# Patient Record
Sex: Female | Born: 1960 | Race: White | Hispanic: No | Marital: Single | Smoking: Never smoker
Health system: Southern US, Community
[De-identification: ages and names within clinical notes are randomized; demographics above are authoritative.]

## PROBLEM LIST (undated history)

## (undated) DIAGNOSIS — S52502A Unspecified fracture of the lower end of left radius, initial encounter for closed fracture: Secondary | ICD-10-CM

## (undated) DIAGNOSIS — F32A Depression, unspecified: Secondary | ICD-10-CM

## (undated) DIAGNOSIS — E785 Hyperlipidemia, unspecified: Secondary | ICD-10-CM

## (undated) DIAGNOSIS — F329 Major depressive disorder, single episode, unspecified: Secondary | ICD-10-CM

## (undated) DIAGNOSIS — I10 Essential (primary) hypertension: Secondary | ICD-10-CM

## (undated) DIAGNOSIS — F419 Anxiety disorder, unspecified: Secondary | ICD-10-CM

## (undated) HISTORY — PX: BREAST CYST ASPIRATION: SHX578

## (undated) HISTORY — PX: CERVICAL DISC ARTHROPLASTY: SHX587

## (undated) HISTORY — PX: AUGMENTATION MAMMAPLASTY: SUR837

---
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
## 2019-05-26 ENCOUNTER — Ambulatory Visit (INDEPENDENT_AMBULATORY_CARE_PROVIDER_SITE_OTHER): Payer: Managed Care, Other (non HMO)

## 2019-05-26 ENCOUNTER — Encounter (HOSPITAL_COMMUNITY): Payer: Self-pay

## 2019-05-26 ENCOUNTER — Ambulatory Visit (HOSPITAL_COMMUNITY)
Admission: EM | Admit: 2019-05-26 | Discharge: 2019-05-26 | Disposition: A | Discharge status: Home / Self Care | Payer: Managed Care, Other (non HMO) | Attending: Physician Assistant | Admitting: Physician Assistant

## 2019-05-26 ENCOUNTER — Other Ambulatory Visit: Payer: Self-pay

## 2019-05-26 DIAGNOSIS — S52572A Other intraarticular fracture of lower end of left radius, initial encounter for closed fracture: Secondary | ICD-10-CM

## 2019-05-26 HISTORY — DX: Hyperlipidemia, unspecified: E78.5

## 2019-05-26 HISTORY — DX: Essential (primary) hypertension: I10

## 2019-05-26 MED ORDER — HYDROCODONE-ACETAMINOPHEN 5-325 MG PO TABS
ORAL_TABLET | ORAL | Status: AC
  Filled 2019-05-26: qty 1

## 2019-05-26 MED ORDER — HYDROCODONE-ACETAMINOPHEN 5-325 MG PO TABS: 1.0000 | ORAL_TABLET | ORAL | 0 refills | Status: AC | PRN

## 2019-05-26 MED ORDER — ONDANSETRON 4 MG PO TBDP
ORAL_TABLET | ORAL | Status: AC
  Filled 2019-05-26: qty 1

## 2019-05-26 MED ORDER — HYDROCODONE-ACETAMINOPHEN 5-325 MG PO TABS
1.0000 | ORAL_TABLET | Freq: Once | ORAL | Status: AC
  Administered 2019-05-26: 1 via ORAL

## 2019-05-26 MED ORDER — ONDANSETRON 4 MG PO TBDP
4.0000 mg | ORAL_TABLET | Freq: Once | ORAL | Status: AC
  Administered 2019-05-26: 12:00:00 4 mg via ORAL

## 2019-05-26 NOTE — Discharge Instructions (Addendum)
Take the Norco/vicodin every 6 hours as needed for moderate to severe pain. Do not drive, operate heavy machinery or drink while taking this medication  You may take ibuprofen for milder pain. If you decide to take only tylenol, please ensure it is more than 4-6 hours before or after taking a norco/vicodin.  Please follow up with the Orthopedic office given.

## 2019-05-26 NOTE — ED Provider Notes (Signed)
[place]    CSN: [id number] Arrival date & time: 05/26/19  1046      History   Chief Complaint Chief Complaint  Patient presents with  . Wrist Pain    HPI Janet Bryan is a 59 y.o. female.   Patient reports to urgent care today for Left wrist injury sustained yesterday. She was letting her dog out of its crate in her vehicle when the dog knocked her over and she struck her left wrist onto a curb behind her. She is unsure if she only hit the wrist or if she used it to brace her fall. She has noted swelling and pain over the wrist and base of thumb. She has had minimal ability to use the wrist and has taken ibuprofen and it does not help much.      Past Medical History:  Diagnosis Date  . Hyperlipemia   . Hypertension     There are no problems to display for this patient.   Past Surgical History:  Procedure Laterality Date  . CERVICAL DISC ARTHROPLASTY      OB History   No obstetric history on file.      Home Medications    Prior to Admission medications   Medication Sig Start Date End Date Taking? Authorizing Provider  FLUoxetine (PROZAC) 40 MG capsule Take 40 mg by mouth daily.   Yes [provider]  quinapril (ACCUPRIL) 10 MG tablet Take 10 mg by mouth daily.   Yes [provider]  simvastatin (ZOCOR) 40 MG tablet Take 40 mg by mouth daily.   Yes [provider]  HYDROcodone-acetaminophen (NORCO/VICODIN) 5-325 MG tablet Take 1-2 tablets by mouth every 4 (four) hours as needed for up to 10 days for moderate pain or severe pain. 05/26/19 06/05/19  Kriss Ishler, Marguerita Beards, PA-C    Family History Family History  Problem Relation Age of Onset  . Healthy Mother   . Healthy Father     Social History Social History   Tobacco Use  . Smoking status: Never Smoker  . Smokeless tobacco: Never Used  Substance Use Topics  . Alcohol use: Not Currently  . Drug use: Not Currently     Allergies   Patient has no known  allergies.   Review of Systems Review of Systems  Musculoskeletal: Positive for arthralgias, joint swelling and myalgias.  Skin: Positive for color change. Negative for wound.  Neurological: Negative for dizziness, weakness, numbness and headaches.  All other systems reviewed and are negative.    Physical Exam Triage Vital Signs ED Triage Vitals  Enc Vitals Group     BP      Pulse      Resp      Temp      Temp src      SpO2      Weight      Height      Head Circumference      Peak Flow      Pain Score      Pain Loc      Pain Edu?      Excl. in [place]?    No data found.  Updated Vital Signs BP 111/83 (BP Location: Right Arm)   Pulse 94   Temp 98.6 F (37 C) (Oral)   Resp 18   SpO2 100%   Visual Acuity Right Eye Distance:   Left Eye Distance:   Bilateral Distance:    Right Eye Near:   Left Eye Near:  Bilateral Near:     Physical Exam Vitals and nursing note reviewed.  Constitutional:      General: She is not in acute distress.    Appearance: Normal appearance. She is well-developed.  HENT:     Head: Normocephalic and atraumatic.  Eyes:     Conjunctiva/sclera: Conjunctivae normal.     Pupils: Pupils are equal, round, and reactive to light.  Cardiovascular:     Rate and Rhythm: Normal rate.     Pulses: Normal pulses.  Pulmonary:     Effort: Pulmonary effort is normal. No respiratory distress.  Abdominal:     Palpations: Abdomen is soft.     Tenderness: There is no abdominal tenderness.  Musculoskeletal:     Cervical back: Neck supple.     Comments: Left wrist with swelling an erythema. TTP across distal radius.  Strong radial pulse, good cap refill, sensation intact  Skin:    General: Skin is warm and dry.     Findings: Bruising and erythema present.  Neurological:     General: No focal deficit present.     Mental Status: She is alert and oriented to person, place, and time.     Sensory: No sensory deficit.  Psychiatric:        Mood and  Affect: Mood normal.        Behavior: Behavior normal.        Thought Content: Thought content normal.        Judgment: Judgment normal.      UC Treatments / Results  Labs (all labs ordered are listed, but only abnormal results are displayed) Labs Reviewed - No data to display  EKG   Radiology DG Wrist Complete Left  Result Date: 05/26/2019 CLINICAL DATA:  59 year old female status post left wrist pain and swelling after falling yesterday afternoon EXAM: LEFT WRIST - COMPLETE 3+ VIEW COMPARISON:  None. FINDINGS: Acute, comminuted and impacted fracture of the distal radius with intra-articular extension. No significant angulation of the fracture site. Extensive soft tissue swelling is present around the wrist. The pronator quadratus fat pad is bowed anteriorly consistent with presence of fracture related hematoma. The distal ulna remains intact. The scaphoid is intact. The remaining bones and joints are intact and unremarkable. IMPRESSION: Acute mildly comminuted and impacted distal radius fracture with intra-articular extension but no significant angulation. Electronically Signed   By: Malachy Moan M.D.   On: 05/26/2019 12:07    Procedures Procedures (including critical care time)  Medications Ordered in UC Medications  HYDROcodone-acetaminophen (NORCO/VICODIN) 5-325 MG per tablet 1 tablet (1 tablet Oral Given 05/26/19 1207)  ondansetron (ZOFRAN-ODT) disintegrating tablet 4 mg (4 mg Oral Given 05/26/19 1207)    Initial Impression / Assessment and Plan / UC Course  I have reviewed the triage vital signs and the nursing notes.  Pertinent labs & imaging results that were available during my care of the patient were reviewed by me and considered in my medical decision making (see chart for details).     #Comminuted Distal Radius fracture - sugar tong applied. Norco 5 in clinic. Sent Norco for pain management. Orthopedic information given. Neurovascularly intact.    Final  Clinical Impressions(s) / UC Diagnoses   Final diagnoses:  Other closed intra-articular fracture of distal end of left radius, initial encounter     Discharge Instructions     Take the Norco/vicodin every 6 hours as needed for moderate to severe pain. Do not drive, operate heavy machinery or drink while taking this  medication  You may take ibuprofen for milder pain. If you decide to take only tylenol, please ensure it is more than 4-6 hours before or after taking a norco/vicodin.  Please follow up with the Orthopedic office given.       ED Prescriptions    Medication Sig Dispense Auth. Provider   HYDROcodone-acetaminophen (NORCO/VICODIN) 5-325 MG tablet Take 1-2 tablets by mouth every 4 (four) hours as needed for up to 10 days for moderate pain or severe pain. 6 tablet Tiffanee Mcnee, Veryl Speak, PA-C     I have reviewed the PDMP during this encounter.   Hermelinda Medicus, PA-C 05/26/19 1345

## 2019-05-26 NOTE — ED Triage Notes (Signed)
Pt states tripped when dog interfered w/her ambulation; landed on a curb hurting her left wrist/hand. Left wrist and hand with deformity, ecchymosis, edema. Neurovascular intact. +1 radial pulse.  Denies LOC or head injury or further injury.

## 2019-05-26 NOTE — ED Notes (Signed)
Ortho aware of need for short arm splint.   

## 2019-05-30 ENCOUNTER — Other Ambulatory Visit: Payer: Self-pay

## 2019-05-30 ENCOUNTER — Encounter: Payer: Self-pay | Admitting: Orthopaedic Surgery

## 2019-05-30 ENCOUNTER — Ambulatory Visit (INDEPENDENT_AMBULATORY_CARE_PROVIDER_SITE_OTHER): Payer: Managed Care, Other (non HMO) | Admitting: Orthopaedic Surgery

## 2019-05-30 DIAGNOSIS — S52572A Other intraarticular fracture of lower end of left radius, initial encounter for closed fracture: Secondary | ICD-10-CM | POA: Diagnosis not present

## 2019-05-30 DIAGNOSIS — S52502A Unspecified fracture of the lower end of left radius, initial encounter for closed fracture: Secondary | ICD-10-CM | POA: Insufficient documentation

## 2019-05-30 MED ORDER — CALCIUM CARBONATE-VITAMIN D 500-200 MG-UNIT PO TABS: 1.0000 | ORAL_TABLET | Freq: Three times a day (TID) | ORAL | 6 refills | Status: AC

## 2019-05-30 MED ORDER — ONDANSETRON HCL 4 MG PO TABS: 4.0000 mg | ORAL_TABLET | Freq: Three times a day (TID) | ORAL | 0 refills | Status: AC | PRN

## 2019-05-30 MED ORDER — VITAMIN C 500 MG PO CHEW: 500.0000 mg | CHEWABLE_TABLET | Freq: Two times a day (BID) | ORAL | 0 refills | Status: AC

## 2019-05-30 MED ORDER — HYDROCODONE-ACETAMINOPHEN 5-325 MG PO TABS: 1.0000 | ORAL_TABLET | Freq: Two times a day (BID) | ORAL | 0 refills | Status: AC | PRN

## 2019-05-30 NOTE — Progress Notes (Signed)
Office Visit Note   Patient: Janet Bryan           Date of Birth: 08-22-1960           MRN: [id number] Visit Date: 05/30/2019              Requested by: No referring provider defined for this encounter. PCP: Patient, No Pcp Per   Assessment & Plan: Visit Diagnoses:  1. Other closed intra-articular fracture of distal end of left radius, initial encounter     Plan: My impression is minimally displaced intra-articular distal radius fracture.  I reviewed the x-rays with the patient in detail and given the orientation of the fracture I feel that the fracture is susceptible to displacement especially the lunate facet piece.  Given these findings my recommendation is for surgical stabilization.  Risk benefits alternatives were discussed with the patient.  Based on our discussion patient has elected to proceed with ORIF early next week.  In the meantime we will place her back in the sugar tong splint and she is to keep this elevated at all times.  I will refill her pain medications as well as her postoperative medications today.  Follow-Up Instructions: Return for 2 week postop visit.   Orders:  No orders of the defined types were placed in this encounter.  Meds ordered this encounter  Medications  . HYDROcodone-acetaminophen (NORCO) 5-325 MG tablet    Sig: Take 1-2 tablets by mouth 2 (two) times daily as needed.    Dispense:  40 tablet    Refill:  0  . calcium-vitamin D (OSCAL WITH D) 500-200 MG-UNIT tablet    Sig: Take 1 tablet by mouth 3 (three) times daily.    Dispense:  90 tablet    Refill:  6  . Ascorbic Acid (VITAMIN C) 500 MG CHEW    Sig: Chew 1 tablet (500 mg total) by mouth 2 (two) times daily.    Dispense:  84 tablet    Refill:  0  . ondansetron (ZOFRAN) 4 MG tablet    Sig: Take 1-2 tablets (4-8 mg total) by mouth every 8 (eight) hours as needed for nausea or vomiting.    Dispense:  20 tablet    Refill:  0      Procedures: No procedures performed   Clinical  Data: No additional findings.   Subjective: Chief Complaint  Patient presents with  . Left Wrist - Pain    Janet Bryan is a very pleasant 59 year old female comes in for evaluation of a left distal radius fracture.  She suffered this on January 28 on the day that she moved here from [place].  She is right-hand dominant and works as a Research scientist (medical).  She was originally evaluated at an urgent care and x-rays were performed and she was placed in a sugar tong splint.  She denies any numbness and tingling.  She does have swelling and bruising.   Review of Systems  Constitutional: Negative.   HENT: Negative.   Eyes: Negative.   Respiratory: Negative.   Cardiovascular: Negative.   Endocrine: Negative.   Musculoskeletal: Negative.   Neurological: Negative.   Hematological: Negative.   Psychiatric/Behavioral: Negative.   All other systems reviewed and are negative.    Objective: Vital Signs: There were no vitals taken for this visit.  Physical Exam Vitals and nursing note reviewed.  Constitutional:      Appearance: She is well-developed.  HENT:     Head: Normocephalic and atraumatic.  Pulmonary:  Effort: Pulmonary effort is normal.  Abdominal:     Palpations: Abdomen is soft.  Musculoskeletal:     Cervical back: Neck supple.  Skin:    General: Skin is warm.     Capillary Refill: Capillary refill takes less than 2 seconds.  Neurological:     Mental Status: She is alert and oriented to person, place, and time.  Psychiatric:        Behavior: Behavior normal.        Thought Content: Thought content normal.        Judgment: Judgment normal.     Ortho Exam Left upper extremity exam shows moderate swelling and bruising.  No signs of carpal tunnel syndrome.  Neurovascular intact distally.  Strong radial pulse. Specialty Comments:  No specialty comments available.  Imaging: No results found.   PMFS History: Patient Active Problem List   Diagnosis Date Noted  . Closed fracture  of left distal radius 05/30/2019   Past Medical History:  Diagnosis Date  . Hyperlipemia   . Hypertension     Family History  Problem Relation Age of Onset  . Healthy Mother   . Healthy Father     Past Surgical History:  Procedure Laterality Date  . CERVICAL DISC ARTHROPLASTY     Social History   Occupational History  . Not on file  Tobacco Use  . Smoking status: Never Smoker  . Smokeless tobacco: Never Used  Substance and Sexual Activity  . Alcohol use: Not Currently  . Drug use: Not Currently  . Sexual activity: Not on file

## 2019-05-31 ENCOUNTER — Other Ambulatory Visit: Payer: Self-pay

## 2019-05-31 ENCOUNTER — Encounter (HOSPITAL_BASED_OUTPATIENT_CLINIC_OR_DEPARTMENT_OTHER): Payer: Self-pay | Admitting: Orthopaedic Surgery

## 2019-06-01 ENCOUNTER — Encounter (HOSPITAL_BASED_OUTPATIENT_CLINIC_OR_DEPARTMENT_OTHER)
Admission: RE | Admit: 2019-06-01 | Discharge: 2019-06-01 | Disposition: A | Discharge status: Home / Self Care | Payer: Managed Care, Other (non HMO) | Source: Ambulatory Visit

## 2019-06-01 ENCOUNTER — Other Ambulatory Visit (HOSPITAL_COMMUNITY)
Admission: RE | Admit: 2019-06-01 | Discharge: 2019-06-01 | Disposition: A | Discharge status: Home / Self Care | Payer: Managed Care, Other (non HMO) | Source: Ambulatory Visit | Attending: Orthopaedic Surgery | Admitting: Orthopaedic Surgery

## 2019-06-01 DIAGNOSIS — I10 Essential (primary) hypertension: Secondary | ICD-10-CM | POA: Diagnosis not present

## 2019-06-01 DIAGNOSIS — Z01812 Encounter for preprocedural laboratory examination: Secondary | ICD-10-CM | POA: Insufficient documentation

## 2019-06-01 DIAGNOSIS — E785 Hyperlipidemia, unspecified: Secondary | ICD-10-CM | POA: Diagnosis not present

## 2019-06-01 DIAGNOSIS — F419 Anxiety disorder, unspecified: Secondary | ICD-10-CM | POA: Diagnosis not present

## 2019-06-01 DIAGNOSIS — F329 Major depressive disorder, single episode, unspecified: Secondary | ICD-10-CM | POA: Diagnosis not present

## 2019-06-01 DIAGNOSIS — Z20822 Contact with and (suspected) exposure to covid-19: Secondary | ICD-10-CM | POA: Insufficient documentation

## 2019-06-01 DIAGNOSIS — Z79899 Other long term (current) drug therapy: Secondary | ICD-10-CM | POA: Diagnosis not present

## 2019-06-01 DIAGNOSIS — X58XXXA Exposure to other specified factors, initial encounter: Secondary | ICD-10-CM | POA: Diagnosis not present

## 2019-06-01 DIAGNOSIS — S52572A Other intraarticular fracture of lower end of left radius, initial encounter for closed fracture: Secondary | ICD-10-CM | POA: Diagnosis present

## 2019-06-01 LAB — SARS CORONAVIRUS 2 (TAT 6-24 HRS): SARS Coronavirus 2: NEGATIVE

## 2019-06-01 NOTE — Progress Notes (Signed)
EKG reviewed by Dr. Odonno, will proceed with surgery as scheduled. 

## 2019-06-01 NOTE — Progress Notes (Signed)
Spoke with Cordelia Pen, Dr. Warren Danes scheduler and made her aware that this pt will need an overnight bed due to the fact that she is new in [place] and has no one to pick her up or stay overnight with her after her surgery.

## 2019-06-01 NOTE — Progress Notes (Signed)

## 2019-06-05 ENCOUNTER — Ambulatory Visit (HOSPITAL_BASED_OUTPATIENT_CLINIC_OR_DEPARTMENT_OTHER): Payer: Managed Care, Other (non HMO) | Admitting: Certified Registered"

## 2019-06-05 ENCOUNTER — Encounter (HOSPITAL_BASED_OUTPATIENT_CLINIC_OR_DEPARTMENT_OTHER): Payer: Self-pay | Admitting: Orthopaedic Surgery

## 2019-06-05 ENCOUNTER — Observation Stay (HOSPITAL_BASED_OUTPATIENT_CLINIC_OR_DEPARTMENT_OTHER)
Admission: RE | Admit: 2019-06-05 | Discharge: 2019-06-06 | Disposition: A | Discharge status: Home / Self Care | Payer: Managed Care, Other (non HMO) | Attending: Orthopaedic Surgery | Admitting: Orthopaedic Surgery

## 2019-06-05 ENCOUNTER — Other Ambulatory Visit: Payer: Self-pay

## 2019-06-05 ENCOUNTER — Encounter (HOSPITAL_BASED_OUTPATIENT_CLINIC_OR_DEPARTMENT_OTHER)
Admission: RE | Disposition: A | Discharge status: Home / Self Care | Payer: Self-pay | Source: Home / Self Care | Attending: Orthopaedic Surgery

## 2019-06-05 DIAGNOSIS — E785 Hyperlipidemia, unspecified: Secondary | ICD-10-CM | POA: Insufficient documentation

## 2019-06-05 DIAGNOSIS — S52572A Other intraarticular fracture of lower end of left radius, initial encounter for closed fracture: Secondary | ICD-10-CM | POA: Diagnosis not present

## 2019-06-05 DIAGNOSIS — Z79899 Other long term (current) drug therapy: Secondary | ICD-10-CM | POA: Insufficient documentation

## 2019-06-05 DIAGNOSIS — Z9889 Other specified postprocedural states: Secondary | ICD-10-CM

## 2019-06-05 DIAGNOSIS — F419 Anxiety disorder, unspecified: Secondary | ICD-10-CM | POA: Insufficient documentation

## 2019-06-05 DIAGNOSIS — S52502A Unspecified fracture of the lower end of left radius, initial encounter for closed fracture: Secondary | ICD-10-CM

## 2019-06-05 DIAGNOSIS — X58XXXA Exposure to other specified factors, initial encounter: Secondary | ICD-10-CM | POA: Insufficient documentation

## 2019-06-05 DIAGNOSIS — F329 Major depressive disorder, single episode, unspecified: Secondary | ICD-10-CM | POA: Insufficient documentation

## 2019-06-05 DIAGNOSIS — I10 Essential (primary) hypertension: Secondary | ICD-10-CM | POA: Insufficient documentation

## 2019-06-05 HISTORY — DX: Anxiety disorder, unspecified: F41.9

## 2019-06-05 HISTORY — DX: Depression, unspecified: F32.A

## 2019-06-05 HISTORY — PX: OPEN REDUCTION INTERNAL FIXATION (ORIF) DISTAL RADIAL FRACTURE: SHX5989

## 2019-06-05 HISTORY — DX: Unspecified fracture of the lower end of left radius, initial encounter for closed fracture: S52.502A

## 2019-06-05 SURGERY — OPEN REDUCTION INTERNAL FIXATION (ORIF) DISTAL RADIUS FRACTURE
Anesthesia: Monitor Anesthesia Care | Site: Wrist | Laterality: Left

## 2019-06-05 MED ORDER — CHLORHEXIDINE GLUCONATE 4 % EX LIQD: 60.0000 mL | Freq: Once | CUTANEOUS | Status: DC

## 2019-06-05 MED ORDER — FLUOXETINE HCL 40 MG PO CAPS: 40.0000 mg | ORAL_CAPSULE | Freq: Every day | ORAL | Status: DC

## 2019-06-05 MED ORDER — ONDANSETRON HCL 4 MG/2ML IJ SOLN
INTRAMUSCULAR | Status: DC | PRN
  Administered 2019-06-05: 4 mg via INTRAVENOUS

## 2019-06-05 MED ORDER — CEFAZOLIN SODIUM-DEXTROSE 2-4 GM/100ML-% IV SOLN
INTRAVENOUS | Status: AC
  Filled 2019-06-05: qty 100

## 2019-06-05 MED ORDER — OXYCODONE HCL 5 MG PO TABS
5.0000 mg | ORAL_TABLET | ORAL | Status: DC | PRN
  Filled 2019-06-05: qty 2

## 2019-06-05 MED ORDER — PROPOFOL 500 MG/50ML IV EMUL
INTRAVENOUS | Status: DC | PRN
  Administered 2019-06-05: 125 ug/kg/min via INTRAVENOUS

## 2019-06-05 MED ORDER — METOCLOPRAMIDE HCL 5 MG PO TABS: 5.0000 mg | ORAL_TABLET | Freq: Three times a day (TID) | ORAL | Status: DC | PRN

## 2019-06-05 MED ORDER — METHOCARBAMOL 1000 MG/10ML IJ SOLN: 500.0000 mg | Freq: Four times a day (QID) | INTRAVENOUS | Status: DC | PRN

## 2019-06-05 MED ORDER — METHOCARBAMOL 500 MG PO TABS
500.0000 mg | ORAL_TABLET | Freq: Four times a day (QID) | ORAL | Status: DC | PRN
  Administered 2019-06-05 – 2019-06-06 (×2): 500 mg via ORAL
  Filled 2019-06-05 (×2): qty 1

## 2019-06-05 MED ORDER — ACETAMINOPHEN 325 MG PO TABS
ORAL_TABLET | ORAL | Status: AC
  Filled 2019-06-05: qty 2

## 2019-06-05 MED ORDER — DIPHENHYDRAMINE HCL 12.5 MG/5ML PO ELIX
25.0000 mg | ORAL_SOLUTION | ORAL | Status: DC | PRN
  Administered 2019-06-05: 25 mg via ORAL
  Filled 2019-06-05: qty 10

## 2019-06-05 MED ORDER — OXYCODONE HCL 5 MG PO TABS: 5.0000 mg | ORAL_TABLET | Freq: Once | ORAL | Status: DC | PRN

## 2019-06-05 MED ORDER — LIDOCAINE HCL (CARDIAC) PF 100 MG/5ML IV SOSY
PREFILLED_SYRINGE | INTRAVENOUS | Status: DC | PRN
  Administered 2019-06-05: 50 mg via INTRAVENOUS

## 2019-06-05 MED ORDER — BUPIVACAINE LIPOSOME 1.3 % IJ SUSP
INTRAMUSCULAR | Status: DC | PRN
  Administered 2019-06-05: 10 mL

## 2019-06-05 MED ORDER — BUPIVACAINE HCL (PF) 0.25 % IJ SOLN
INTRAMUSCULAR | Status: AC
  Filled 2019-06-05: qty 30

## 2019-06-05 MED ORDER — ONDANSETRON HCL 4 MG/2ML IJ SOLN: 4.0000 mg | Freq: Four times a day (QID) | INTRAMUSCULAR | Status: DC | PRN

## 2019-06-05 MED ORDER — CALCIUM CARBONATE-VITAMIN D 500-200 MG-UNIT PO TABS: 1.0000 | ORAL_TABLET | Freq: Three times a day (TID) | ORAL | Status: DC

## 2019-06-05 MED ORDER — ONDANSETRON HCL 4 MG/2ML IJ SOLN
INTRAMUSCULAR | Status: AC
  Filled 2019-06-05: qty 2

## 2019-06-05 MED ORDER — SORBITOL 70 % SOLN: 30.0000 mL | Freq: Every day | Status: DC | PRN

## 2019-06-05 MED ORDER — CEFAZOLIN SODIUM-DEXTROSE 2-4 GM/100ML-% IV SOLN
2.0000 g | Freq: Four times a day (QID) | INTRAVENOUS | Status: DC
  Administered 2019-06-05 – 2019-06-06 (×2): 2 g via INTRAVENOUS
  Filled 2019-06-05 (×2): qty 100

## 2019-06-05 MED ORDER — LACTATED RINGERS IV SOLN: INTRAVENOUS | Status: DC

## 2019-06-05 MED ORDER — OXYCODONE HCL 5 MG/5ML PO SOLN: 5.0000 mg | Freq: Once | ORAL | Status: DC | PRN

## 2019-06-05 MED ORDER — SODIUM CHLORIDE 0.9 % IV SOLN: INTRAVENOUS | Status: DC

## 2019-06-05 MED ORDER — SIMVASTATIN 40 MG PO TABS: 40.0000 mg | ORAL_TABLET | Freq: Every day | ORAL | Status: DC

## 2019-06-05 MED ORDER — BUPIVACAINE HCL (PF) 0.5 % IJ SOLN
INTRAMUSCULAR | Status: DC | PRN
  Administered 2019-06-05: 15 mL via PERINEURAL

## 2019-06-05 MED ORDER — HYDROMORPHONE HCL 1 MG/ML IJ SOLN: 0.5000 mg | INTRAMUSCULAR | Status: DC | PRN

## 2019-06-05 MED ORDER — FENTANYL CITRATE (PF) 100 MCG/2ML IJ SOLN
50.0000 ug | INTRAMUSCULAR | Status: DC | PRN
  Administered 2019-06-05: 100 ug via INTRAVENOUS

## 2019-06-05 MED ORDER — PROPOFOL 500 MG/50ML IV EMUL
INTRAVENOUS | Status: AC
  Filled 2019-06-05: qty 50

## 2019-06-05 MED ORDER — KETOROLAC TROMETHAMINE 15 MG/ML IJ SOLN
15.0000 mg | Freq: Four times a day (QID) | INTRAMUSCULAR | Status: DC | PRN
  Administered 2019-06-05: 19:00:00 15 mg via INTRAVENOUS
  Filled 2019-06-05: qty 1

## 2019-06-05 MED ORDER — DOCUSATE SODIUM 100 MG PO CAPS: 100.0000 mg | ORAL_CAPSULE | Freq: Two times a day (BID) | ORAL | Status: DC

## 2019-06-05 MED ORDER — MAGNESIUM CITRATE PO SOLN: 1.0000 | Freq: Once | ORAL | Status: DC | PRN

## 2019-06-05 MED ORDER — ACETAMINOPHEN 325 MG PO TABS: 325.0000 mg | ORAL_TABLET | Freq: Four times a day (QID) | ORAL | Status: DC | PRN

## 2019-06-05 MED ORDER — ONDANSETRON HCL 4 MG PO TABS: 4.0000 mg | ORAL_TABLET | Freq: Four times a day (QID) | ORAL | Status: DC | PRN

## 2019-06-05 MED ORDER — CEFAZOLIN SODIUM-DEXTROSE 2-4 GM/100ML-% IV SOLN
2.0000 g | INTRAVENOUS | Status: AC
  Administered 2019-06-05: 13:00:00 2 g via INTRAVENOUS

## 2019-06-05 MED ORDER — OXYCODONE HCL 5 MG PO TABS
10.0000 mg | ORAL_TABLET | ORAL | Status: DC | PRN
  Administered 2019-06-06 (×2): 10 mg via ORAL
  Filled 2019-06-05: qty 2

## 2019-06-05 MED ORDER — ONDANSETRON HCL 4 MG/2ML IJ SOLN: 4.0000 mg | Freq: Once | INTRAMUSCULAR | Status: DC | PRN

## 2019-06-05 MED ORDER — MIDAZOLAM HCL 2 MG/2ML IJ SOLN
1.0000 mg | INTRAMUSCULAR | Status: DC | PRN
  Administered 2019-06-05: 12:00:00 2 mg via INTRAVENOUS

## 2019-06-05 MED ORDER — ACETAMINOPHEN 325 MG PO TABS
650.0000 mg | ORAL_TABLET | Freq: Once | ORAL | Status: AC
  Administered 2019-06-05: 650 mg via ORAL

## 2019-06-05 MED ORDER — FENTANYL CITRATE (PF) 100 MCG/2ML IJ SOLN: 25.0000 ug | INTRAMUSCULAR | Status: DC | PRN

## 2019-06-05 MED ORDER — VITAMIN C 500 MG PO CHEW: 500.0000 mg | CHEWABLE_TABLET | Freq: Two times a day (BID) | ORAL | Status: DC

## 2019-06-05 MED ORDER — METOCLOPRAMIDE HCL 5 MG/ML IJ SOLN: 5.0000 mg | Freq: Three times a day (TID) | INTRAMUSCULAR | Status: DC | PRN

## 2019-06-05 MED ORDER — PROPOFOL 10 MG/ML IV BOLUS
INTRAVENOUS | Status: AC
  Filled 2019-06-05: qty 20

## 2019-06-05 MED ORDER — POLYETHYLENE GLYCOL 3350 17 G PO PACK: 17.0000 g | PACK | Freq: Every day | ORAL | Status: DC | PRN

## 2019-06-05 MED ORDER — LISINOPRIL 10 MG PO TABS: 10.0000 mg | ORAL_TABLET | Freq: Every day | ORAL | Status: DC

## 2019-06-05 MED ORDER — EPHEDRINE SULFATE-NACL 50-0.9 MG/10ML-% IV SOSY
PREFILLED_SYRINGE | INTRAVENOUS | Status: DC | PRN
  Administered 2019-06-05: 15 mg via INTRAVENOUS

## 2019-06-05 MED ORDER — FENTANYL CITRATE (PF) 100 MCG/2ML IJ SOLN
INTRAMUSCULAR | Status: AC
  Filled 2019-06-05: qty 2

## 2019-06-05 MED ORDER — MIDAZOLAM HCL 2 MG/2ML IJ SOLN
INTRAMUSCULAR | Status: AC
  Filled 2019-06-05: qty 2

## 2019-06-05 SURGICAL SUPPLY — 80 items
BIT DRILL 2.2 SS TIBIAL (BIT) ×2 IMPLANT
BLADE MINI RND TIP GREEN BEAV (BLADE) IMPLANT
BLADE SURG 15 STRL LF DISP TIS (BLADE) ×2 IMPLANT
BLADE SURG 15 STRL SS (BLADE) ×2
BNDG ELASTIC 3X5.8 VLCR STR LF (GAUZE/BANDAGES/DRESSINGS) ×2 IMPLANT
BNDG ELASTIC 4X5.8 VLCR STR LF (GAUZE/BANDAGES/DRESSINGS) IMPLANT
BNDG ESMARK 4X9 LF (GAUZE/BANDAGES/DRESSINGS) ×2 IMPLANT
BRUSH SCRUB EZ PLAIN DRY (MISCELLANEOUS) ×2 IMPLANT
CORD BIPOLAR FORCEPS 12FT (ELECTRODE) ×2 IMPLANT
COVER BACK TABLE 60X90IN (DRAPES) ×2 IMPLANT
COVER MAYO STAND STRL (DRAPES) ×2 IMPLANT
COVER WAND RF STERILE (DRAPES) IMPLANT
CUFF TOURN SGL QUICK 18X4 (TOURNIQUET CUFF) ×2 IMPLANT
DECANTER SPIKE VIAL GLASS SM (MISCELLANEOUS) IMPLANT
DRAPE EXTREMITY T 121X128X90 (DISPOSABLE) ×2 IMPLANT
DRAPE IMP U-DRAPE 54X76 (DRAPES) ×2 IMPLANT
DRAPE OEC MINIVIEW 54X84 (DRAPES) ×2 IMPLANT
DRAPE SURG 17X23 STRL (DRAPES) ×2 IMPLANT
GAUZE 4X4 16PLY RFD (DISPOSABLE) IMPLANT
GAUZE SPONGE 4X4 12PLY STRL (GAUZE/BANDAGES/DRESSINGS) ×2 IMPLANT
GAUZE XEROFORM 1X8 LF (GAUZE/BANDAGES/DRESSINGS) ×2 IMPLANT
GLOVE BIOGEL PI IND STRL 7.0 (GLOVE) ×2 IMPLANT
GLOVE BIOGEL PI INDICATOR 7.0 (GLOVE) ×2
GLOVE ECLIPSE 6.5 STRL STRAW (GLOVE) ×2 IMPLANT
GLOVE ECLIPSE 7.0 STRL STRAW (GLOVE) ×2 IMPLANT
GLOVE SKINSENSE NS SZ7.5 (GLOVE) ×1
GLOVE SKINSENSE STRL SZ7.5 (GLOVE) ×1 IMPLANT
GLOVE SURG SYN 7.5  E (GLOVE) ×1
GLOVE SURG SYN 7.5 E (GLOVE) ×1 IMPLANT
GOWN STRL REIN XL XLG (GOWN DISPOSABLE) ×2 IMPLANT
GOWN STRL REUS W/ TWL LRG LVL3 (GOWN DISPOSABLE) ×1 IMPLANT
GOWN STRL REUS W/ TWL XL LVL3 (GOWN DISPOSABLE) ×1 IMPLANT
GOWN STRL REUS W/TWL LRG LVL3 (GOWN DISPOSABLE) ×1
GOWN STRL REUS W/TWL XL LVL3 (GOWN DISPOSABLE) ×1
K-WIRE 1.6 (WIRE) ×2
K-WIRE FX5X1.6XNS BN SS (WIRE) ×2
KWIRE FX5X1.6XNS BN SS (WIRE) ×2 IMPLANT
NEEDLE HYPO 22GX1.5 SAFETY (NEEDLE) IMPLANT
NS IRRIG 1000ML POUR BTL (IV SOLUTION) ×2 IMPLANT
PACK BASIN DAY SURGERY FS (CUSTOM PROCEDURE TRAY) ×2 IMPLANT
PAD CAST 3X4 CTTN HI CHSV (CAST SUPPLIES) ×1 IMPLANT
PADDING CAST ABS 3INX4YD NS (CAST SUPPLIES)
PADDING CAST ABS COTTON 3X4 (CAST SUPPLIES) IMPLANT
PADDING CAST COTTON 3X4 STRL (CAST SUPPLIES) ×1
PADDING CAST SYNTHETIC 3 NS LF (CAST SUPPLIES) ×1
PADDING CAST SYNTHETIC 3X4 NS (CAST SUPPLIES) ×1 IMPLANT
PADDING CAST SYNTHETIC 4 (CAST SUPPLIES)
PADDING CAST SYNTHETIC 4X4 STR (CAST SUPPLIES) IMPLANT
PLATE STANDARD DVR LEFT (Plate) ×2 IMPLANT
PLATE STD DVR LT 24X51 (Plate) ×1 IMPLANT
SCREW LOCK 14X2.7X 3 LD TPR (Screw) ×3 IMPLANT
SCREW LOCK 16X2.7X 3 LD TPR (Screw) ×2 IMPLANT
SCREW LOCK 18X2.7X 3 LD TPR (Screw) ×1 IMPLANT
SCREW LOCK 20X2.7X 3 LD TPR (Screw) ×2 IMPLANT
SCREW LOCK 22X2.7X 3 LD TPR (Screw) ×1 IMPLANT
SCREW LOCKING 2.7X14 (Screw) ×3 IMPLANT
SCREW LOCKING 2.7X16 (Screw) ×2 IMPLANT
SCREW LOCKING 2.7X18 (Screw) ×1 IMPLANT
SCREW LOCKING 2.7X20MM (Screw) ×2 IMPLANT
SCREW LOCKING 2.7X22MM (Screw) ×1 IMPLANT
SCREW LP NL 2.7X22MM (Screw) ×2 IMPLANT
SLEEVE SCD COMPRESS KNEE MED (MISCELLANEOUS) ×2 IMPLANT
SPLINT FIBERGLASS 3X35 (CAST SUPPLIES) IMPLANT
SPONGE LAP 18X18 RF (DISPOSABLE) ×2 IMPLANT
STOCKINETTE 4X48 STRL (DRAPES) ×2 IMPLANT
SUCTION FRAZIER HANDLE 10FR (MISCELLANEOUS) ×1
SUCTION TUBE FRAZIER 10FR DISP (MISCELLANEOUS) ×1 IMPLANT
SUT ETHILON 3 0 PS 1 (SUTURE) ×2 IMPLANT
SUT ETHILON 4 0 PS 2 18 (SUTURE) ×2 IMPLANT
SUT VIC AB 2-0 SH 27 (SUTURE)
SUT VIC AB 2-0 SH 27XBRD (SUTURE) IMPLANT
SUT VIC AB 3-0 PS1 18 (SUTURE) ×1
SUT VIC AB 3-0 PS1 18XBRD (SUTURE) ×1 IMPLANT
SYR BULB 3OZ (MISCELLANEOUS) IMPLANT
SYR CONTROL 10ML LL (SYRINGE) IMPLANT
TOWEL GREEN STERILE FF (TOWEL DISPOSABLE) ×4 IMPLANT
TRAY DSU PREP LF (CUSTOM PROCEDURE TRAY) ×2 IMPLANT
TUBE CONNECTING 20X1/4 (TUBING) ×2 IMPLANT
UNDERPAD 30X36 HEAVY ABSORB (UNDERPADS AND DIAPERS) ×2 IMPLANT
YANKAUER SUCT BULB TIP NO VENT (SUCTIONS) IMPLANT

## 2019-06-05 NOTE — Anesthesia Procedure Notes (Signed)
Anesthesia Regional Block: Supraclavicular block   Pre-Anesthetic Checklist: ,, timeout performed, Correct Patient, Correct Site, Correct Laterality, Correct Procedure, Correct Position, site marked, Risks and benefits discussed,  Surgical consent,  Pre-op evaluation,  At surgeon's request and post-op pain management  Laterality: Left  Prep: chloraprep       Needles:  Injection technique: Single-shot  Needle Type: Echogenic Stimulator Needle     Needle Length: 9cm  Needle Gauge: 21     Additional Needles:   Procedures:,,,, ultrasound used (permanent image in chart),,,,  Narrative:  Start time: 06/05/2019 12:13 PM End time: 06/05/2019 12:17 PM Injection made incrementally with aspirations every 5 mL.  Performed by: Personally  Anesthesiologist: Lucretia Kern, MD  Additional Notes: Monitors applied. Injection made in 5cc increments. No resistance to injection. Good needle visualization. Patient tolerated procedure well.

## 2019-06-05 NOTE — Op Note (Signed)
   DATE OF SURGERY: 06/05/2019  PREOPERATIVE DIAGNOSIS: Left intraarticular distal radius fracture  POSTOPERATIVE DIAGNOSIS: same  PROCEDURE: Open reduction and internal fixation, left distal radius.  CPT [id number]   IMPLANT:  Implant Name Type Inv. Item Serial No. Manufacturer Lot No. LRB No. Used Action  LOG [id number] - [employer] BIOMET CROSSLOCK DVR SET - 1      Left    SCREW LOCKING 2.7X14 - [id number] Screw SCREW LOCKING 2.7X14  [employer](ORTH,TRAU,BIO,SG)  Left 3 Implanted  SCREW LOCKING 2.7X16 - [id number] Screw SCREW LOCKING 2.7X16  [employer](ORTH,TRAU,BIO,SG)  Left 2 Implanted  SCREW LOCKING 2.7X18 - [id number] Screw SCREW LOCKING 2.7X18  [employer](ORTH,TRAU,BIO,SG)  Left 1 Implanted  SCREW LOCKING 2.7X20MM - [id number] Screw SCREW LOCKING 2.7X20MM  [employer](ORTH,TRAU,BIO,SG)  Left 2 Implanted  SCREW LOCKING 2.7X22MM - [id number] Screw SCREW LOCKING 2.7X22MM  [employer](ORTH,TRAU,BIO,SG)  Left 1 Implanted  SCREW LP NL 2.7X22MM - [id number] Screw SCREW LP NL 2.7X22MM  [employer](ORTH,TRAU,BIO,SG)  Left 1 Implanted  PLATE STANDARD DVR LEFT - [id number] Plate PLATE STANDARD DVR LEFT  [employer](ORTH,TRAU,BIO,SG)  Left 1 Implanted     SURGEON: N. Eduard Roux, M.D.  ASSIST: Ciro Backer [facility] [place], [place]; necessary for the timely completion of procedure and due to complexity of procedure.  ANESTHESIA: Regional and MAC  TOURNIQUET TIME: less than 30 mins  BLOOD LOSS: Minimal.  COMPLICATIONS: see description of procedure.  PATHOLOGY: None.  TIME OUT: Performed before the start of procedure.  INDICATIONS: The patient is a 59 y.o. female who presented with a displaced intraarticular distal radius fracture indicated for osteosynthesis.  DESCRIPTION OF PROCEDURE:  The patient was identified in the preoperative holding area.  The operative site was marked by the surgeon and confirmed by the patient.  He was brought back to the operating room.  Anesthesia was induced by the anesthesia  team.  A well padded nonsterile tourniquet was placed. The operative extremity was prepped and draped in standard sterile fashion.  A timeout was performed.  Preoperative antibiotics were given. The extremity was exsanguinated, tourniquet inflated to 250 mmHg.  A FCR approach was used.  Dissection through the sheath of FCR was carried out and the FCR tendon was retracted. The floor of FCR sheath was released. Flexor tendons and median nerve were retracted ulnarly and protected. Pronator quadratus was elevated from distal radius. Fracture lines were visualized and the fracture demonstrated multiple fragements. The ulnar portion of the distal radius was reduced under fluoroscopy with a tenaculum clamp.  A volar locking plate was applied to the volar surface of the distal radius at the appropriate position. Locking distal locking screws and nonlockgin shaft screws were inserted. Fluoroscopy was used to show adequate reduction and placement of hardware. The tourniquet was deflated. Hemostasis was achieved. The wound was irrigated.  The pronator quadratus was placed back on top of the plate.   Incision closed in layers. Sterile dressing applied. Wrist immobilized in a removable brace. The patient was transferred to the recovery room in stable condition after all counts were correct.  POSTOPERATIVE PLAN: The patient will remain in the brace until instructed. Sutures will be removed in two weeks. About four weeks after surgery, will start wrist range of motion exercises, but in the meantime before then she will start aggressive finger range of motion exercises without resistance.  The "six pack of hand exercises" handout was given to the patient.

## 2019-06-05 NOTE — Anesthesia Postprocedure Evaluation (Signed)
Anesthesia Post Note  Patient: Janet Bryan  Procedure(s) Performed: OPEN REDUCTION INTERNAL FIXATION (ORIF) LEFT DISTAL RADIUS FRACTURE (Left Wrist)     Patient location during evaluation: PACU Anesthesia Type: Regional and MAC Level of consciousness: awake and alert Pain management: pain level controlled Vital Signs Assessment: post-procedure vital signs reviewed and stable Respiratory status: spontaneous breathing, nonlabored ventilation, respiratory function stable and patient connected to nasal cannula oxygen Cardiovascular status: stable and blood pressure returned to baseline Postop Assessment: no apparent nausea or vomiting Anesthetic complications: no    Last Vitals:  Vitals:   06/05/19 1447 06/05/19 1500  BP:  126/87  Pulse: 62 66  Resp: 15 16  Temp:    SpO2: 96% 93%    Last Pain:  Vitals:   06/05/19 1442  TempSrc:   PainSc: 6                  Ayano Douthitt L Katalyna Socarras

## 2019-06-05 NOTE — H&P (Signed)
PREOPERATIVE H&P  Chief Complaint: left distal radius fracture  HPI: Janet Bryan is a 59 y.o. female who presents for surgical treatment of left distal radius fracture.  She denies any changes in medical history.  Past Medical History:  Diagnosis Date  . Anxiety   . Depression   . Distal radius fracture, left   . Hyperlipemia   . Hypertension    Past Surgical History:  Procedure Laterality Date  . CERVICAL DISC ARTHROPLASTY     Social History   Socioeconomic History  . Marital status: Single    Spouse name: Not on file  . Number of children: Not on file  . Years of education: Not on file  . Highest education level: Not on file  Occupational History  . Not on file  Tobacco Use  . Smoking status: Never Smoker  . Smokeless tobacco: Never Used  Substance and Sexual Activity  . Alcohol use: Yes    Comment: social  . Drug use: Not Currently  . Sexual activity: Not Currently    Birth control/protection: Post-menopausal  Other Topics Concern  . Not on file  Social History Narrative  . Not on file   Social Determinants of Health   Financial Resource Strain:   . Difficulty of Paying Living Expenses: Not on file  Food Insecurity:   . Worried About Programme researcher, broadcasting/film/video in the Last Year: Not on file  . Ran Out of Food in the Last Year: Not on file  Transportation Needs:   . Lack of Transportation (Medical): Not on file  . Lack of Transportation (Non-Medical): Not on file  Physical Activity:   . Days of Exercise per Week: Not on file  . Minutes of Exercise per Session: Not on file  Stress:   . Feeling of Stress : Not on file  Social Connections:   . Frequency of Communication with Friends and Family: Not on file  . Frequency of Social Gatherings with Friends and Family: Not on file  . Attends Religious Services: Not on file  . Active Member of Clubs or Organizations: Not on file  . Attends Banker Meetings: Not on file  . Marital Status: Not on  file   Family History  Problem Relation Age of Onset  . Healthy Mother   . Healthy Father    No Known Allergies Prior to Admission medications   Medication Sig Start Date End Date Taking? Authorizing Provider  Ascorbic Acid (VITAMIN C) 500 MG CHEW Chew 1 tablet (500 mg total) by mouth 2 (two) times daily. 05/30/19  Yes Tarry Kos, MD  calcium-vitamin D (OSCAL WITH D) 500-200 MG-UNIT tablet Take 1 tablet by mouth 3 (three) times daily. 05/30/19  Yes Tarry Kos, MD  FLUoxetine (PROZAC) 40 MG capsule Take 40 mg by mouth daily.   Yes [provider]  HYDROcodone-acetaminophen (NORCO) 5-325 MG tablet Take 1-2 tablets by mouth 2 (two) times daily as needed. 05/30/19  Yes Tarry Kos, MD  quinapril (ACCUPRIL) 10 MG tablet Take 10 mg by mouth daily.   Yes [provider]  simvastatin (ZOCOR) 40 MG tablet Take 40 mg by mouth daily.   Yes [provider]  HYDROcodone-acetaminophen (NORCO/VICODIN) 5-325 MG tablet Take 1-2 tablets by mouth every 4 (four) hours as needed for up to 10 days for moderate pain or severe pain. 05/26/19 06/05/19  Darr, Veryl Speak, PA-C  ondansetron (ZOFRAN) 4 MG tablet Take 1-2 tablets (4-8 mg total) by mouth  every 8 (eight) hours as needed for nausea or vomiting. 05/30/19   Leandrew Koyanagi, MD     Positive ROS: All other systems have been reviewed and were otherwise negative with the exception of those mentioned in the HPI and as above.  Physical Exam: General: Alert, no acute distress Cardiovascular: No pedal edema Respiratory: No cyanosis, no use of accessory musculature GI: abdomen soft Skin: No lesions in the area of chief complaint Neurologic: Sensation intact distally Psychiatric: Patient is competent for consent with normal mood and affect Lymphatic: no lymphedema  MUSCULOSKELETAL: exam stable  Assessment: left distal radius fracture  Plan: Plan for Procedure(s): OPEN REDUCTION INTERNAL FIXATION (ORIF) LEFT DISTAL RADIUS  FRACTURE  The risks benefits and alternatives were discussed with the patient including but not limited to the risks of nonoperative treatment, versus surgical intervention including infection, bleeding, nerve injury,  blood clots, cardiopulmonary complications, morbidity, mortality, among others, and they were willing to proceed.   Eduard Roux, MD   06/05/2019 7:01 AM

## 2019-06-05 NOTE — Progress Notes (Signed)
Assisted Dr. Witman with left, ultrasound guided, supraclavicular block. Side rails up, monitors on throughout procedure. See vital signs in flow sheet. Tolerated Procedure well. °

## 2019-06-05 NOTE — Discharge Instructions (Signed)

## 2019-06-05 NOTE — Anesthesia Preprocedure Evaluation (Addendum)
Anesthesia Evaluation  Patient identified by MRN, date of birth, ID band Patient awake    Reviewed: Allergy & Precautions, NPO status , Patient's Chart, lab work & pertinent test results  History of Anesthesia Complications Negative for: history of anesthetic complications  Airway Mallampati: II  TM Distance: >3 FB Neck ROM: Full    Dental  (+) Teeth Intact   Pulmonary neg pulmonary ROS,    Pulmonary exam normal        Cardiovascular hypertension, Normal cardiovascular exam     Neuro/Psych PSYCHIATRIC DISORDERS Anxiety Depression negative neurological ROS     GI/Hepatic negative GI ROS, Neg liver ROS,   Endo/Other  negative endocrine ROS  Renal/GU negative Renal ROS  negative genitourinary   Musculoskeletal negative musculoskeletal ROS (+)   Abdominal   Peds  Hematology negative hematology ROS (+)   Anesthesia Other Findings   Reproductive/Obstetrics                            Anesthesia Physical Anesthesia Plan  ASA: II  Anesthesia Plan: MAC and Regional   Post-op Pain Management:  Regional for Post-op pain   Induction: Intravenous  PONV Risk Score and Plan: Propofol infusion, TIVA and Treatment may vary due to age or medical condition  Airway Management Planned: Natural Airway, Nasal Cannula and Simple Face Mask  Additional Equipment: None  Intra-op Plan:   Post-operative Plan:   Informed Consent: I have reviewed the patients History and Physical, chart, labs and discussed the procedure including the risks, benefits and alternatives for the proposed anesthesia with the patient or authorized representative who has indicated his/her understanding and acceptance.       Plan Discussed with:   Anesthesia Plan Comments:        Anesthesia Quick Evaluation

## 2019-06-05 NOTE — Transfer of Care (Signed)
Immediate Anesthesia Transfer of Care Note  Patient: Janet Bryan  Procedure(s) Performed: OPEN REDUCTION INTERNAL FIXATION (ORIF) LEFT DISTAL RADIUS FRACTURE (Left Wrist)  Patient Location: PACU  Anesthesia Type:MAC and Regional  Level of Consciousness: awake, alert  and oriented  Airway & Oxygen Therapy: Patient Spontanous Breathing  Post-op Assessment: Report given to RN and Post -op Vital signs reviewed and stable  Post vital signs: Reviewed and stable  Last Vitals:  Vitals Value Taken Time  BP    Temp    Pulse 79 06/05/19 1410  Resp 17 06/05/19 1410  SpO2 93 % 06/05/19 1410  Vitals shown include unvalidated device data.  Last Pain:  Vitals:   06/05/19 1122  TempSrc: Tympanic  PainSc: 0-No pain      Patients Stated Pain Goal: 8 (08/65/78 4696)  Complications: No apparent anesthesia complications

## 2019-06-06 ENCOUNTER — Encounter: Payer: Self-pay | Admitting: *Deleted

## 2019-06-06 DIAGNOSIS — S52572A Other intraarticular fracture of lower end of left radius, initial encounter for closed fracture: Secondary | ICD-10-CM | POA: Diagnosis not present

## 2019-06-08 NOTE — Discharge Summary (Signed)
Patient ID: Janet Bryan MRN: [id number] DOB/AGE: [birth year] 60 y.o.  Admit date: 06/05/2019 Discharge date: 06/08/2019  Admission Diagnoses:  Closed fracture of left distal radius  Discharge Diagnoses:  Principal Problem:   Closed fracture of left distal radius Active Problems:   History of open reduction and internal fixation (ORIF) procedure   Past Medical History:  Diagnosis Date  . Anxiety   . Depression   . Distal radius fracture, left   . Hyperlipemia   . Hypertension     Surgeries: Procedure(s): OPEN REDUCTION INTERNAL FIXATION (ORIF) LEFT DISTAL RADIUS FRACTURE on 06/05/2019   Consultants (if any):   Discharged Condition: Improved  Hospital Course: Janet Bryan is an 59 y.o. female who was admitted 06/05/2019 with a diagnosis of Closed fracture of left distal radius and went to the operating room on 06/05/2019 and underwent the above named procedures.    She was given perioperative antibiotics:  Anti-infectives (From admission, onward)   Start     Dose/Rate Route Frequency Ordered Stop   06/05/19 1445  ceFAZolin (ANCEF) IVPB 2g/100 mL premix  Status:  Discontinued     2 g 200 mL/hr over 30 Minutes Intravenous Every 6 hours 06/05/19 1439 06/06/19 0844   06/05/19 1115  ceFAZolin (ANCEF) IVPB 2g/100 mL premix     2 g 200 mL/hr over 30 Minutes Intravenous On call to O.R. 06/05/19 1102 06/05/19 1325    .  She was given sequential compression devices, early ambulation, and appropriate chemoprophylaxis for DVT prophylaxis.  She benefited maximally from the hospital stay and there were no complications.    Recent vital signs:  Vitals:   06/06/19 0600 06/06/19 0630  BP:  106/70  Pulse:  78  Resp:  16  Temp:  98 F (36.7 C)  SpO2: 95% 97%    Recent laboratory studies:  No results found for: HGB No results found for: WBC, PLT No results found for: INR No results found for: NA, K, CL, CO2, BUN, CREATININE, GLUCOSE  Discharge Medications:     Allergies as of 06/06/2019   No Known Allergies     Medication List    TAKE these medications   calcium-vitamin D 500-200 MG-UNIT tablet Commonly known as: OSCAL WITH D Take 1 tablet by mouth 3 (three) times daily.   FLUoxetine 40 MG capsule Commonly known as: PROZAC Take 40 mg by mouth daily.   HYDROcodone-acetaminophen 5-325 MG tablet Commonly known as: Norco Take 1-2 tablets by mouth 2 (two) times daily as needed.   ondansetron 4 MG tablet Commonly known as: ZOFRAN Take 1-2 tablets (4-8 mg total) by mouth every 8 (eight) hours as needed for nausea or vomiting.   quinapril 10 MG tablet Commonly known as: ACCUPRIL Take 10 mg by mouth daily.   simvastatin 40 MG tablet Commonly known as: ZOCOR Take 40 mg by mouth daily.   Vitamin C 500 MG Chew Chew 1 tablet (500 mg total) by mouth 2 (two) times daily.     ASK your doctor about these medications   HYDROcodone-acetaminophen 5-325 MG tablet Commonly known as: NORCO/VICODIN Take 1-2 tablets by mouth every 4 (four) hours as needed for up to 10 days for moderate pain or severe pain. Ask about: Should I take this medication?       Diagnostic Studies: DG Wrist Complete Left  Result Date: 05/26/2019 CLINICAL DATA:  59 year old female status post left wrist pain and swelling after falling yesterday afternoon EXAM: LEFT WRIST - COMPLETE 3+ VIEW  COMPARISON:  None. FINDINGS: Acute, comminuted and impacted fracture of the distal radius with intra-articular extension. No significant angulation of the fracture site. Extensive soft tissue swelling is present around the wrist. The pronator quadratus fat pad is bowed anteriorly consistent with presence of fracture related hematoma. The distal ulna remains intact. The scaphoid is intact. The remaining bones and joints are intact and unremarkable. IMPRESSION: Acute mildly comminuted and impacted distal radius fracture with intra-articular extension but no significant angulation.  Electronically Signed   By: Malachy Moan M.D.   On: 05/26/2019 12:07    Disposition: Discharge disposition: 01-Home or Self Care       Discharge Instructions    Call MD / Call 911   Complete by: As directed    If you experience chest pain or shortness of breath, CALL 911 and be transported to the hospital emergency room.  If you develope a fever above 101.5 F, pus (white drainage) or increased drainage or redness at the wound, or calf pain, call your surgeon's office.   Constipation Prevention   Complete by: As directed    Drink plenty of fluids.  Prune juice may be helpful.  You may use a stool softener, such as Colace (over the counter) 100 mg twice a day.  Use MiraLax (over the counter) for constipation as needed.   Driving restrictions   Complete by: As directed    No driving while taking narcotic pain meds.   Increase activity slowly as tolerated   Complete by: As directed       Follow-up Information    Tarry Kos, MD In 2 weeks.   Specialty: Orthopedic Surgery Why: For suture removal, For wound re-check Contact information: [address] [place] [address] [phone]            Signed: Glee Arvin 06/08/2019, 5:07 PM

## 2019-06-20 ENCOUNTER — Encounter: Payer: Self-pay | Admitting: Orthopaedic Surgery

## 2019-06-20 ENCOUNTER — Ambulatory Visit (INDEPENDENT_AMBULATORY_CARE_PROVIDER_SITE_OTHER): Payer: Managed Care, Other (non HMO) | Admitting: Orthopaedic Surgery

## 2019-06-20 ENCOUNTER — Other Ambulatory Visit: Payer: Self-pay

## 2019-06-20 ENCOUNTER — Ambulatory Visit (INDEPENDENT_AMBULATORY_CARE_PROVIDER_SITE_OTHER): Payer: Managed Care, Other (non HMO)

## 2019-06-20 DIAGNOSIS — S52572D Other intraarticular fracture of lower end of left radius, subsequent encounter for closed fracture with routine healing: Secondary | ICD-10-CM

## 2019-06-20 NOTE — Progress Notes (Signed)
   Post-Op Visit Note   Patient: Janet Bryan           Date of Birth: 1960/06/11           MRN: [id number] Visit Date: 06/20/2019 PCP: Patient, No Pcp Per   Assessment & Plan:  Chief Complaint:  Chief Complaint  Patient presents with  . Left Wrist - Pain, Routine Post Op   Visit Diagnoses:  1. Other closed intra-articular fracture of distal end of left radius with routine healing, subsequent encounter     Plan: Patient is a pleasant 59 year old female who comes in today 2 weeks status post ORIF left distal radius fracture, date of surgery 06/05/2019.  She has been doing well.  She has minimal pain.  No fevers or chills.  Examination of her left wrist reveals a well-healing surgical incision with nylon sutures in place.  No evidence of infection or cellulitis.  Minimal to no tenderness to the distal radius.  Fingers are warm and well-perfused.  Today, nylon sutures were removed and Steri-Strips applied.  We will place the patient in a removable wrist splint nonweightbearing.  He will follow-up with [place] in 2 weeks time for repeat evaluation and 3 view x-rays of the left wrist.  At that point, we will anticipate starting hand therapy.  Call with concerns or questions in the meantime.  Follow-Up Instructions: Return in about 2 weeks (around 07/04/2019).   Orders:  Orders Placed This Encounter  Procedures  . XR Wrist Complete Left   No orders of the defined types were placed in this encounter.   Imaging: XR Wrist Complete Left  Result Date: 06/20/2019 X-rays demonstrate stable alignment of the fracture without hardware complication   PMFS History: Patient Active Problem List   Diagnosis Date Noted  . History of open reduction and internal fixation (ORIF) procedure 06/05/2019  . Closed fracture of left distal radius 05/30/2019   Past Medical History:  Diagnosis Date  . Anxiety   . Depression   . Distal radius fracture, left   . Hyperlipemia   . Hypertension     Family  History  Problem Relation Age of Onset  . Healthy Mother   . Healthy Father     Past Surgical History:  Procedure Laterality Date  . CERVICAL DISC ARTHROPLASTY    . OPEN REDUCTION INTERNAL FIXATION (ORIF) DISTAL RADIAL FRACTURE Left 06/05/2019   Procedure: OPEN REDUCTION INTERNAL FIXATION (ORIF) LEFT DISTAL RADIUS FRACTURE;  Surgeon: Tarry Kos, MD;  Location: [facility];  Service: Orthopedics;  Laterality: Left;   Social History   Occupational History  . Not on file  Tobacco Use  . Smoking status: Never Smoker  . Smokeless tobacco: Never Used  Substance and Sexual Activity  . Alcohol use: Yes    Comment: social  . Drug use: Not Currently  . Sexual activity: Not Currently    Birth control/protection: Post-menopausal

## 2019-07-04 ENCOUNTER — Other Ambulatory Visit: Payer: Self-pay

## 2019-07-04 ENCOUNTER — Ambulatory Visit (INDEPENDENT_AMBULATORY_CARE_PROVIDER_SITE_OTHER): Payer: 59

## 2019-07-04 ENCOUNTER — Encounter: Payer: Self-pay | Admitting: Orthopaedic Surgery

## 2019-07-04 ENCOUNTER — Ambulatory Visit (INDEPENDENT_AMBULATORY_CARE_PROVIDER_SITE_OTHER): Payer: 59 | Admitting: Orthopaedic Surgery

## 2019-07-04 DIAGNOSIS — S52572D Other intraarticular fracture of lower end of left radius, subsequent encounter for closed fracture with routine healing: Secondary | ICD-10-CM | POA: Diagnosis not present

## 2019-07-04 NOTE — Progress Notes (Signed)
   Post-Op Visit Note   Patient: Janet Bryan           Date of Birth: [birth year]           MRN: [id number] Visit Date: 07/04/2019 PCP: Patient, No Pcp Per   Assessment & Plan:  Chief Complaint:  Chief Complaint  Patient presents with  . Left Wrist - Pain   Visit Diagnoses:  1. Other closed intra-articular fracture of distal end of left radius with routine healing, subsequent encounter     Plan: Janet Bryan is 4 weeks status post ORIF distal radius fracture.  She is doing well overall.  She is not taking any medications for pain.  She has no complaints.  Surgical incision is healed without any signs of infection.  She has mild to moderate swelling.  Range of motion is improving.  At this point we will place her in a removable wrist brace to wear at all times and begin hand and wrist rehab.  Prescription was given today.  No medications needed from her standpoint.  Recheck in 4 weeks with two-view x-rays of the left wrist.  Follow-Up Instructions: Return in about 4 weeks (around 08/01/2019).   Orders:  Orders Placed This Encounter  Procedures  . XR Wrist Complete Left   No orders of the defined types were placed in this encounter.   Imaging: XR Wrist Complete Left  Result Date: 07/04/2019 Stable fixation of distal radius fracture.  No hardware complications.   PMFS History: Patient Active Problem List   Diagnosis Date Noted  . History of open reduction and internal fixation (ORIF) procedure 06/05/2019  . Closed fracture of left distal radius 05/30/2019   Past Medical History:  Diagnosis Date  . Anxiety   . Depression   . Distal radius fracture, left   . Hyperlipemia   . Hypertension     Family History  Problem Relation Age of Onset  . Healthy Mother   . Healthy Father     Past Surgical History:  Procedure Laterality Date  . CERVICAL DISC ARTHROPLASTY    . OPEN REDUCTION INTERNAL FIXATION (ORIF) DISTAL RADIAL FRACTURE Left 06/05/2019   Procedure: OPEN REDUCTION  INTERNAL FIXATION (ORIF) LEFT DISTAL RADIUS FRACTURE;  Surgeon: Tarry Kos, MD;  Location: [facility];  Service: Orthopedics;  Laterality: Left;   Social History   Occupational History  . Not on file  Tobacco Use  . Smoking status: Never Smoker  . Smokeless tobacco: Never Used  Substance and Sexual Activity  . Alcohol use: Yes    Comment: social  . Drug use: Not Currently  . Sexual activity: Not Currently    Birth control/protection: Post-menopausal

## 2019-08-01 ENCOUNTER — Other Ambulatory Visit: Payer: Self-pay

## 2019-08-01 ENCOUNTER — Encounter: Payer: Self-pay | Admitting: Orthopaedic Surgery

## 2019-08-01 ENCOUNTER — Ambulatory Visit (INDEPENDENT_AMBULATORY_CARE_PROVIDER_SITE_OTHER): Payer: 59

## 2019-08-01 ENCOUNTER — Ambulatory Visit (INDEPENDENT_AMBULATORY_CARE_PROVIDER_SITE_OTHER): Payer: 59 | Admitting: Orthopaedic Surgery

## 2019-08-01 DIAGNOSIS — S52572D Other intraarticular fracture of lower end of left radius, subsequent encounter for closed fracture with routine healing: Secondary | ICD-10-CM | POA: Diagnosis not present

## 2019-08-01 NOTE — Progress Notes (Signed)
   Post-Op Visit Note   Patient: Janet Bryan           Date of Birth: 1960-10-13           MRN: [id number] Visit Date: 08/01/2019 PCP: Patient, No Pcp Per   Assessment & Plan:  Chief Complaint:  Chief Complaint  Patient presents with  . Left Wrist - Pain   Visit Diagnoses:  1. Other closed intra-articular fracture of distal end of left radius with routine healing, subsequent encounter     Plan: [name] is a week status post ORIF left distal radius fracture.  She reports no pain overall doing better.  She has been doing hand and wrist therapy for the last 3 weeks.  She has discontinue the wrist brace and has done well from this.  She is currently back to work.  Her surgical scar is fully healed.  She still has some mild decreased range of motion with wrist flexion and extension.  Scar is nontender.  I do hear a little bit of popping with supination but I do not appreciate any tendon subluxation.  Her x-rays demonstrate significant interval healing of the fracture.  At this point I would like her to continue with hand therapy for the next 3weeks.  I would anticipate that the stiffness will continue to improve with continued healing with time.  Recheck in 4 weeks with two-view x-rays of the left wrist.  Follow-Up Instructions: Return in about 4 weeks (around 08/29/2019).   Orders:  Orders Placed This Encounter  Procedures  . XR Wrist Complete Left   No orders of the defined types were placed in this encounter.   Imaging: XR Wrist Complete Left  Result Date: 08/01/2019 Significant interval healing of distal radius fracture without hardware complications.   PMFS History: Patient Active Problem List   Diagnosis Date Noted  . History of open reduction and internal fixation (ORIF) procedure 06/05/2019  . Closed fracture of left distal radius 05/30/2019   Past Medical History:  Diagnosis Date  . Anxiety   . Depression   . Distal radius fracture, left   . Hyperlipemia   .  Hypertension     Family History  Problem Relation Age of Onset  . Healthy Mother   . Healthy Father     Past Surgical History:  Procedure Laterality Date  . CERVICAL DISC ARTHROPLASTY    . OPEN REDUCTION INTERNAL FIXATION (ORIF) DISTAL RADIAL FRACTURE Left 06/05/2019   Procedure: OPEN REDUCTION INTERNAL FIXATION (ORIF) LEFT DISTAL RADIUS FRACTURE;  Surgeon: Tarry Kos, MD;  Location: [facility];  Service: Orthopedics;  Laterality: Left;   Social History   Occupational History  . Not on file  Tobacco Use  . Smoking status: Never Smoker  . Smokeless tobacco: Never Used  Substance and Sexual Activity  . Alcohol use: Yes    Comment: social  . Drug use: Not Currently  . Sexual activity: Not Currently    Birth control/protection: Post-menopausal

## 2019-08-29 ENCOUNTER — Other Ambulatory Visit: Payer: Self-pay

## 2019-08-29 ENCOUNTER — Encounter: Payer: Self-pay | Admitting: Orthopaedic Surgery

## 2019-08-29 ENCOUNTER — Ambulatory Visit (INDEPENDENT_AMBULATORY_CARE_PROVIDER_SITE_OTHER): Payer: 59 | Admitting: Orthopaedic Surgery

## 2019-08-29 ENCOUNTER — Ambulatory Visit (INDEPENDENT_AMBULATORY_CARE_PROVIDER_SITE_OTHER): Payer: 59

## 2019-08-29 DIAGNOSIS — Z9889 Other specified postprocedural states: Secondary | ICD-10-CM

## 2019-08-29 NOTE — Progress Notes (Signed)
   Post-Op Visit Note   Patient: Janet Bryan           Date of Birth: 1961/01/24           MRN: [id number] Visit Date: 08/29/2019 PCP: Patient, No Pcp Per   Assessment & Plan:  Chief Complaint:  Chief Complaint  Patient presents with  . Left Wrist - Pain   Visit Diagnoses:  1. History of open reduction and internal fixation (ORIF) procedure     Plan: Janet Bryan is 3 months status post ORIF left.  Overall she is doing well has plateaued with hand therapy.  She reports minimal discomfort not taking any medications.  She is feeling a popping sensation on the ulnar aspect of the wrist.  Hand therapy feels that this could be due to ECU tenosynovitis subluxation.  On physical exam her surgical scar is fully healed.  She has much better pronation supination.  She does have a slight click/pop with supination/pronation.  She has tenderness along the ECU tendon.  I do not feel any subluxation.  TFCC fovea is nontender.  Her x-rays demonstrate healing of the fracture.  My impression is that she has ECU tenosynovitis.  Treatment options were discussed including cortisone injection but patient would like to try oral and topical NSAIDs and give this some more time to see if this will get better.  In terms of follow-up she would like to leave it open-ended and she will call us if the ECU tenosynovitis continues to bother her.  Follow-Up Instructions: Return if symptoms worsen or fail to improve.   Orders:  Orders Placed This Encounter  Procedures  . XR Wrist 2 Views Left   No orders of the defined types were placed in this encounter.   Imaging: XR Wrist 2 Views Left  Result Date: 08/29/2019 Healed distal radius fracture without hardware complication.   PMFS History: Patient Active Problem List   Diagnosis Date Noted  . History of open reduction and internal fixation (ORIF) procedure 06/05/2019  . Closed fracture of left distal radius 05/30/2019   Past Medical History:  Diagnosis Date    . Anxiety   . Depression   . Distal radius fracture, left   . Hyperlipemia   . Hypertension     Family History  Problem Relation Age of Onset  . Healthy Mother   . Healthy Father     Past Surgical History:  Procedure Laterality Date  . CERVICAL DISC ARTHROPLASTY    . OPEN REDUCTION INTERNAL FIXATION (ORIF) DISTAL RADIAL FRACTURE Left 06/05/2019   Procedure: OPEN REDUCTION INTERNAL FIXATION (ORIF) LEFT DISTAL RADIUS FRACTURE;  Surgeon: Tarry Kos, MD;  Location: [facility];  Service: Orthopedics;  Laterality: Left;   Social History   Occupational History  . Not on file  Tobacco Use  . Smoking status: Never Smoker  . Smokeless tobacco: Never Used  Substance and Sexual Activity  . Alcohol use: Yes    Comment: social  . Drug use: Not Currently  . Sexual activity: Not Currently    Birth control/protection: Post-menopausal

## 2019-09-06 ENCOUNTER — Other Ambulatory Visit: Payer: Self-pay | Admitting: Family Medicine

## 2019-09-06 DIAGNOSIS — Z1231 Encounter for screening mammogram for malignant neoplasm of breast: Secondary | ICD-10-CM

## 2019-11-23 ENCOUNTER — Ambulatory Visit
Admission: RE | Admit: 2019-11-23 | Discharge: 2019-11-23 | Disposition: A | Discharge status: Home / Self Care | Payer: 59 | Source: Ambulatory Visit | Attending: Family Medicine | Admitting: Family Medicine

## 2019-11-23 DIAGNOSIS — Z1231 Encounter for screening mammogram for malignant neoplasm of breast: Secondary | ICD-10-CM

## 2021-01-31 IMAGING — MG DIGITAL SCREENING BREAST BILAT IMPLANT W/ TOMO W/ CAD
8 of 12 series · 8 of 28 positions shown · non-contrast
Comparison: Previous exam(s).

CLINICAL DATA: Screening.

EXAM:
DIGITAL SCREENING BILATERAL MAMMOGRAM WITH IMPLANTS, CAD AND TOMO
The patient has retropectoral implants. Standard and implant
displaced views were performed.

[R MLO]
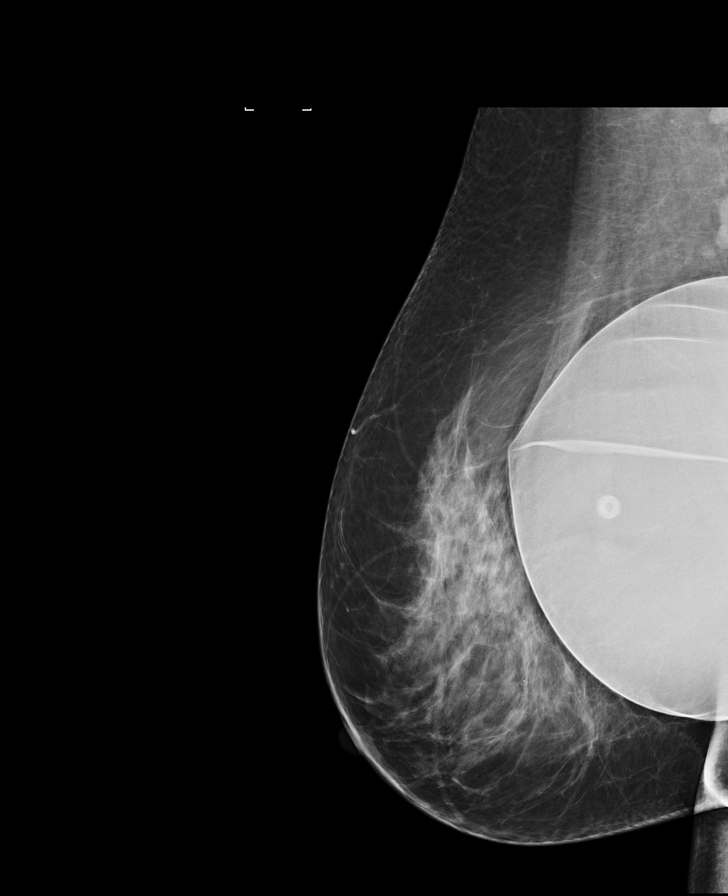

[R CC]
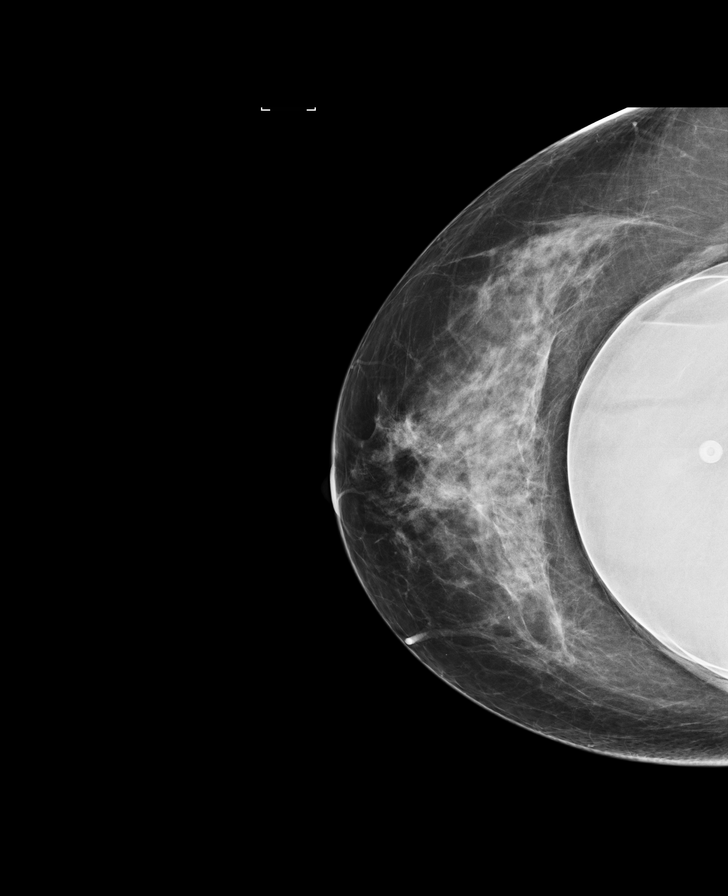

[L CC]
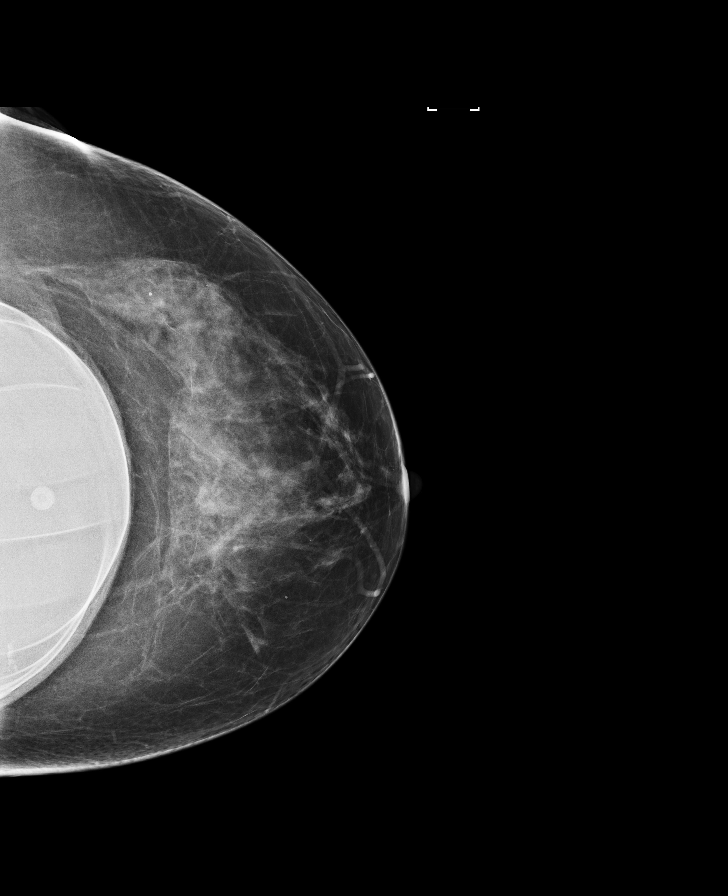

[L MLO]
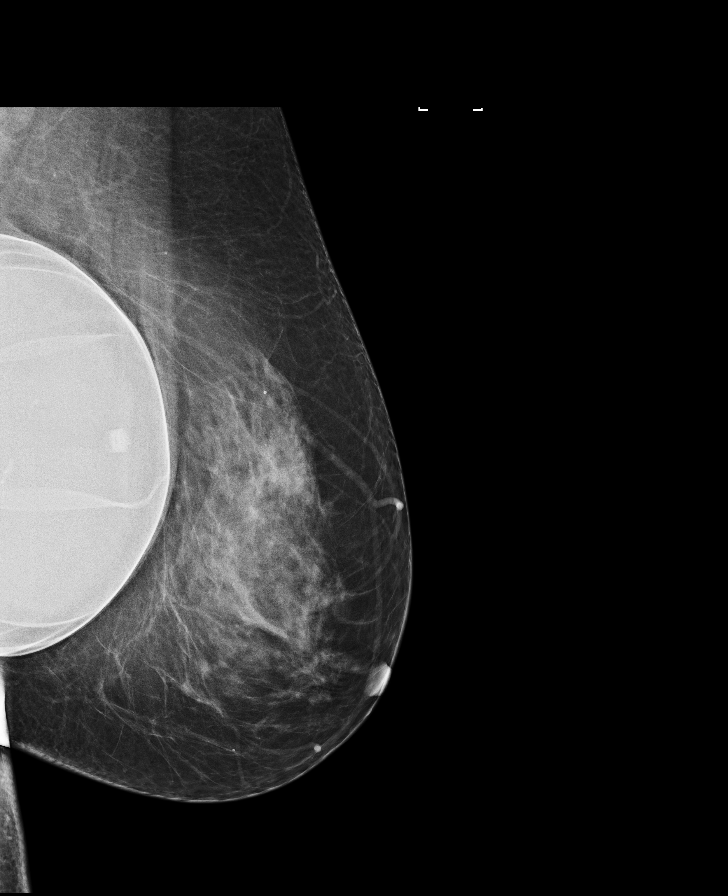

[R MLO synth-2D]
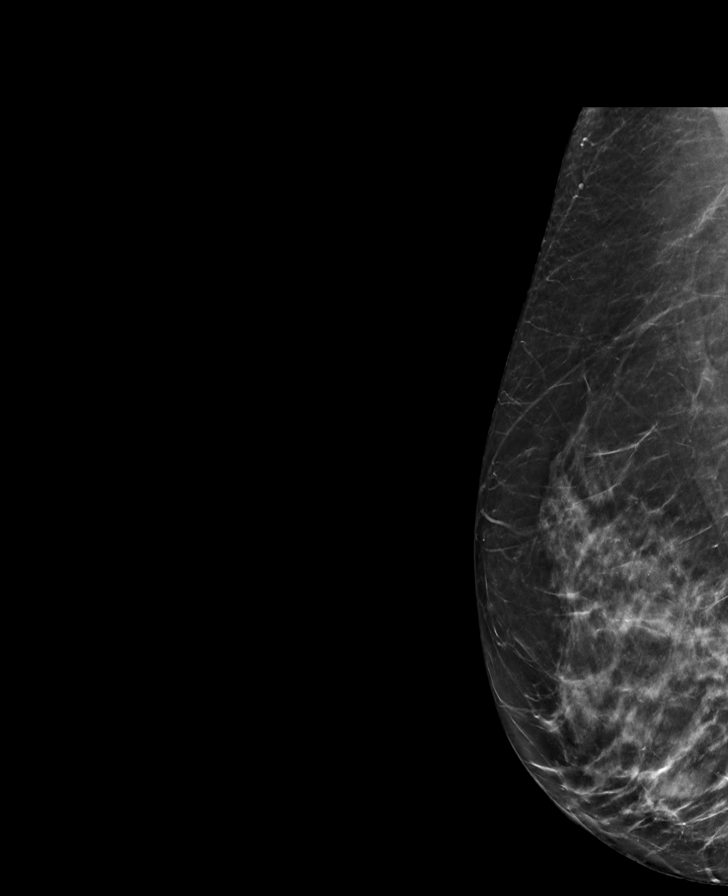

[L CC synth-2D]
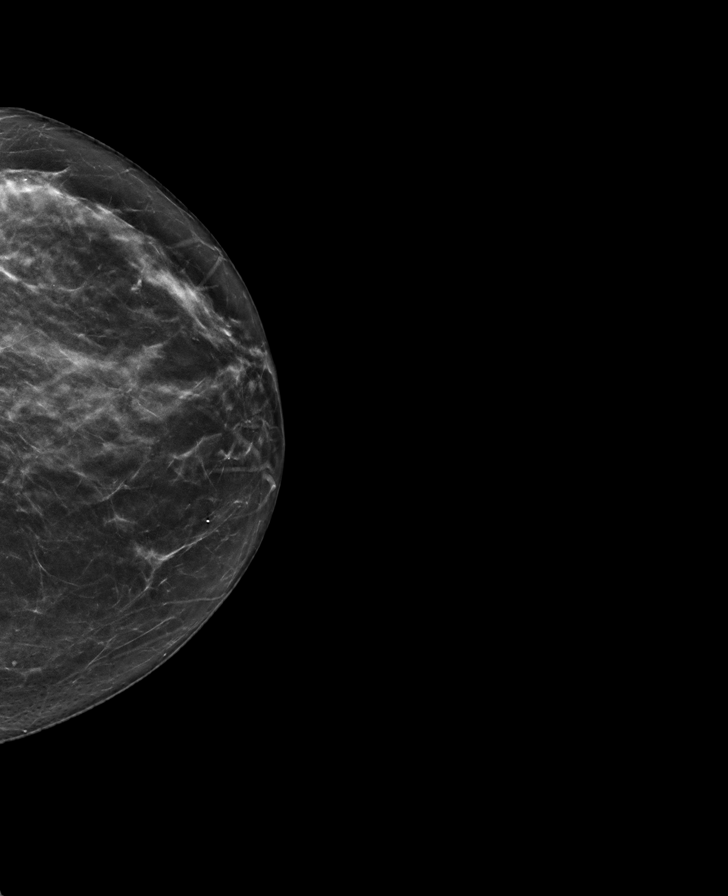

[R CC synth-2D]
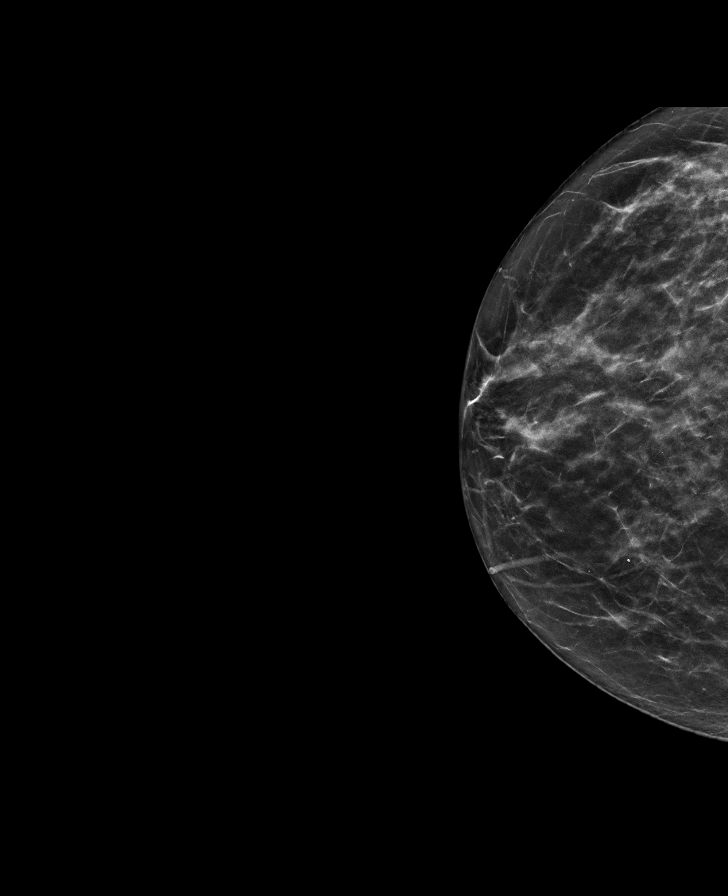

[L MLO synth-2D]
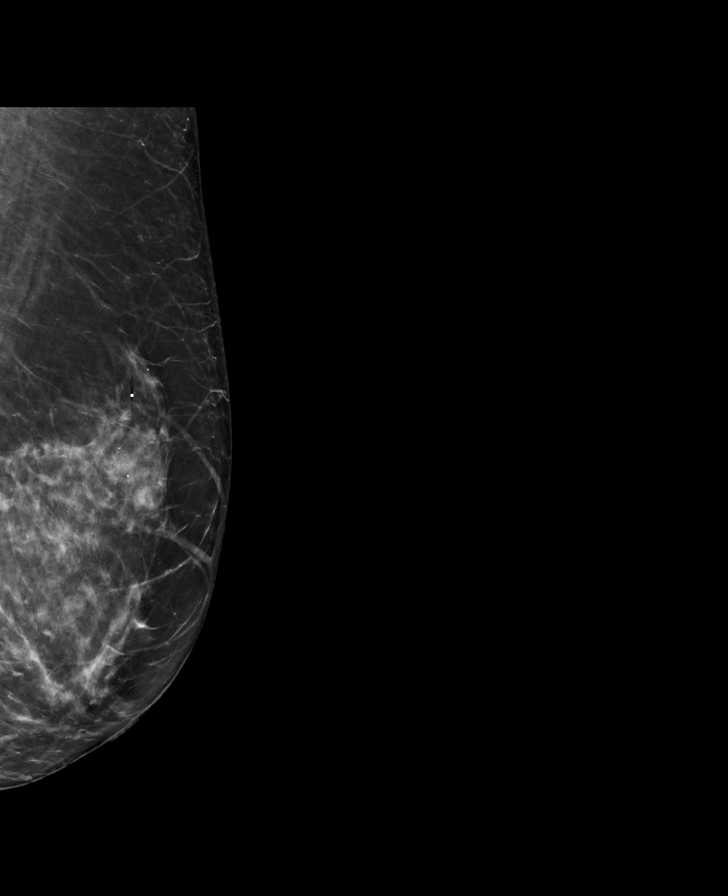

[8 of 28 positions shown; findings below may reference images not displayed]

ACR Breast Density Category c: The breast tissue is heterogeneously
dense, which may obscure small masses.
FINDINGS: There are no findings suspicious for malignancy. Images were
processed with CAD.
IMPRESSION: No mammographic evidence of malignancy. A result letter of this
screening mammogram will be mailed directly to the patient.

RECOMMENDATION:
Screening mammogram in one year. (Code:49-X-OQ9)

BI-RADS CATEGORY  1:  Negative.

## 2021-02-13 ENCOUNTER — Other Ambulatory Visit: Payer: Self-pay | Admitting: Family Medicine

## 2021-02-13 DIAGNOSIS — Z1231 Encounter for screening mammogram for malignant neoplasm of breast: Secondary | ICD-10-CM

## 2021-03-01 ENCOUNTER — Ambulatory Visit
Admission: RE | Admit: 2021-03-01 | Discharge: 2021-03-01 | Disposition: A | Discharge status: Home / Self Care | Payer: 59 | Source: Ambulatory Visit | Attending: Family Medicine | Admitting: Family Medicine

## 2021-03-01 ENCOUNTER — Other Ambulatory Visit: Payer: Self-pay

## 2021-03-01 DIAGNOSIS — Z1231 Encounter for screening mammogram for malignant neoplasm of breast: Secondary | ICD-10-CM

## 2023-10-25 ENCOUNTER — Ambulatory Visit: Payer: 59 | Admitting: Internal Medicine

## 2023-12-10 ENCOUNTER — Ambulatory Visit (INDEPENDENT_AMBULATORY_CARE_PROVIDER_SITE_OTHER)

## 2023-12-10 ENCOUNTER — Ambulatory Visit: Admitting: Surgical

## 2023-12-10 DIAGNOSIS — M958 Other specified acquired deformities of musculoskeletal system: Secondary | ICD-10-CM

## 2023-12-10 DIAGNOSIS — S43214A Anterior dislocation of right sternoclavicular joint, initial encounter: Secondary | ICD-10-CM

## 2023-12-11 ENCOUNTER — Encounter: Payer: Self-pay | Admitting: Surgical

## 2023-12-11 NOTE — Progress Notes (Signed)
 Office Visit Note   Patient: Janet Bryan           Date of Birth: 09/21/1960           MRN: [id number] Visit Date: 12/10/2023 Requested by: Rolinda Millman, MD [phone] [address] [place],  [place] [address] PCP: Patient, No Pcp Per  Subjective: Chief Complaint  Patient presents with   Other    Patient reports having deformity/lump near sternal end of clavicle Noticed for the last year-without any injury-states it is not painful    HPI: Janet Bryan is a 63 y.o. female who presents to the office reporting right clavicle prominence.  She states that she has had more prominent medial end of the clavicle that she has noticed over the last year.  She denies any history of injury to this region.  She has no pain.  She will have very occasional achiness but this is very sporadic and not consistent.  She denies any fevers or chills.  Pain does not wake her from sleep.  She has no history of IV drug abuse.  She is right-hand dominant.  Does not bother her to sleep on that side.  No mechanical clicking symptoms..                ROS: All systems reviewed are negative as they relate to the chief complaint within the history of present illness.  Patient denies fevers or chills.  Assessment & Plan: Visit Diagnoses:  1. Anterior dislocation of right sternoclavicular joint, initial encounter   2. Deformity of clavicle     Plan: Impression is 64 year old female with atraumatic right sternoclavicular joint dislocation anteriorly based on serendipity radiographs taken today.  We discussed options available to patient.  With the lack of any symptoms that she is experiencing, would recommend just trying topical Voltaren as needed for the achiness that she will occasionally have.  Aside from that, would caution her against any sort of procedure or intervention to fix this problem given her lack of symptoms.  Patient agreed with plan.  Follow-up with the office as needed.  Follow-Up  Instructions: No follow-ups on file.   Orders:  Orders Placed This Encounter  Procedures   XR Clavicle Right   No orders of the defined types were placed in this encounter.     Procedures: No procedures performed   Clinical Data: No additional findings.  Objective: Vital Signs: There were no vitals taken for this visit.  Physical Exam:  Constitutional: Patient appears well-developed HEENT:  Head: Normocephalic Eyes:EOM are normal Neck: Normal range of motion Cardiovascular: Normal rate Pulmonary/chest: Effort normal Neurologic: Patient is alert Skin: Skin is warm Psychiatric: Patient has normal mood and affect  Ortho Exam: Ortho exam demonstrates right shoulder with full active and passive range of motion comparable to the contralateral side.  There is increased prominence of the medial clavicle head compared with the contralateral side.  There is no tenderness to palpation.  No redness or erythema or any significant swelling.  The clavicle feels to be dislocated from sternoclavicular joint and there is no laxity at play in terms of trying to reduce the [facility] joint dislocation.  2+ radial pulse of the right upper extremity.  Intact rotator cuff strength of supra, infra, subscap.  Specialty Comments:  No specialty comments available.  Imaging: No results found.   PMFS History: Patient Active Problem List   Diagnosis Date Noted   History of open reduction and internal fixation (ORIF)  procedure 06/05/2019   Closed fracture of left distal radius 05/30/2019   Past Medical History:  Diagnosis Date   Anxiety    Depression    Distal radius fracture, left    Hyperlipemia    Hypertension     Family History  Problem Relation Age of Onset   Healthy Mother    Healthy Father     Past Surgical History:  Procedure Laterality Date   AUGMENTATION MAMMAPLASTY     BREAST CYST ASPIRATION     CERVICAL DISC ARTHROPLASTY     OPEN REDUCTION INTERNAL FIXATION (ORIF) DISTAL RADIAL  FRACTURE Left 06/05/2019   Procedure: OPEN REDUCTION INTERNAL FIXATION (ORIF) LEFT DISTAL RADIUS FRACTURE;  Surgeon: Jerri Kay HERO, MD;  Location: [facility];  Service: Orthopedics;  Laterality: Left;   Social History   Occupational History   Not on file  Tobacco Use   Smoking status: Never   Smokeless tobacco: Never  Substance and Sexual Activity   Alcohol use: Yes    Comment: social   Drug use: Not Currently   Sexual activity: Not Currently    Birth control/protection: Post-menopausal

## 2024-02-28 ENCOUNTER — Encounter: Payer: Self-pay | Admitting: Radiology
